# Patient Record
Sex: Male | Born: 1995 | Race: Black or African American | Hispanic: No | Marital: Single | Smoking: Never smoker
Health system: Southern US, Community
[De-identification: ages and names within clinical notes are randomized; demographics above are authoritative.]

## PROBLEM LIST (undated history)

## (undated) DIAGNOSIS — N200 Calculus of kidney: Secondary | ICD-10-CM

---
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
## 2017-06-04 ENCOUNTER — Emergency Department (HOSPITAL_COMMUNITY)
Admission: EM | Admit: 2017-06-04 | Discharge: 2017-06-05 | Disposition: A | Discharge status: Home / Self Care | Payer: 59 | Attending: Emergency Medicine | Admitting: Emergency Medicine

## 2017-06-04 ENCOUNTER — Encounter (HOSPITAL_COMMUNITY): Payer: Self-pay

## 2017-06-04 ENCOUNTER — Other Ambulatory Visit: Payer: Self-pay

## 2017-06-04 ENCOUNTER — Emergency Department (HOSPITAL_COMMUNITY): Payer: 59

## 2017-06-04 DIAGNOSIS — J988 Other specified respiratory disorders: Secondary | ICD-10-CM

## 2017-06-04 DIAGNOSIS — R0602 Shortness of breath: Secondary | ICD-10-CM | POA: Diagnosis present

## 2017-06-04 DIAGNOSIS — B9789 Other viral agents as the cause of diseases classified elsewhere: Secondary | ICD-10-CM

## 2017-06-04 DIAGNOSIS — B349 Viral infection, unspecified: Secondary | ICD-10-CM | POA: Insufficient documentation

## 2017-06-04 HISTORY — DX: Calculus of kidney: N20.0

## 2017-06-04 MED ORDER — BENZONATATE 100 MG PO CAPS: 100.0000 mg | ORAL_CAPSULE | Freq: Three times a day (TID) | ORAL | 0 refills | Status: AC | PRN

## 2017-06-04 MED ORDER — IBUPROFEN 600 MG PO TABS: 600.0000 mg | ORAL_TABLET | Freq: Four times a day (QID) | ORAL | 0 refills | Status: AC | PRN

## 2017-06-04 NOTE — ED Triage Notes (Signed)
States started 2 days ago with headache and now coughing up yellowish secretions and today was breathing fast and felt like he would pass out not eating much due to nausea and vomiting.

## 2017-06-04 NOTE — ED Provider Notes (Signed)
[facility]-EMERGENCY DEPT Provider Note   CSN: [id number] Arrival date & time: 06/04/17  2114     History   Chief Complaint Chief Complaint  Patient presents with  . Shortness of Breath    HPI Henry Pineda is a 22 y.o. male.  22 year old male presents to the emergency department for evaluation of upper respiratory symptoms.  He has been experiencing a cough productive of yellow sputum.  He denies hemoptysis.  He has also been experiencing nasal congestion with postnasal drip.  Mother reports fever of 101F prior to arrival for which patient received antipyretics.  He had a violent coughing episode prior to arrival which prompted posttussive emesis.  Patient then began to feel increasing palpitations.  He was hyperventilating at this time and developed paresthesias in his face, lips, extremities.  He felt lightheaded.  The symptoms have spontaneously resolved.  He denies a history of anxiety.  He has been medicating his cough/cold symptoms with DayQuil and NyQuil.  These have been providing little relief.     Past Medical History:  Diagnosis Date  . Kidney stones     Patient Active Problem List   Diagnosis Date Noted  . 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 No pertinent surgical history.      Home Medications    Prior to Admission medications   Medication Sig Start Date End Date Taking? Authorizing Provider  benzonatate (TESSALON) 100 MG capsule Take 1-2 capsules (100-200 mg total) by mouth 3 (three) times daily as needed for cough. 06/04/17   Antony Madura, PA-C  ibuprofen (ADVIL,MOTRIN) 600 MG tablet Take 1 tablet (600 mg total) by mouth every 6 (six) hours as needed. 06/04/17   Antony Madura, PA-C    Family History Family History  Problem Relation Age of Onset  . Hypertension Maternal Grandmother   . Diabetes Maternal Grandfather     Social History Social History   Tobacco Use  . Smoking status: Never Smoker  . Smokeless  tobacco: Never Used  Substance Use Topics  . Alcohol use: No  . Drug use: No     Allergies   Patient has no known allergies.   Review of Systems Review of Systems Ten systems reviewed and are negative for acute change, except as noted in the HPI.    Physical Exam Updated Vital Signs BP 114/64 (BP Location: Left Arm)   Pulse (!) 50   Temp (!) 97.4 F (36.3 C) (Oral)   Resp (!) 21   Ht 5\' 2"  (1.575 m)   Wt 54.4 kg (120 lb)   SpO2 96%   BMI 21.95 kg/m   Physical Exam  Constitutional: He is oriented to person, place, and time. He appears well-developed and well-nourished. No distress.  Nontoxic appearing, reserved. In NAD.  HENT:  Head: Normocephalic and atraumatic.  Eyes: Conjunctivae and EOM are normal. No scleral icterus.  Neck: Normal range of motion.  No meningismus  Cardiovascular: Normal rate, regular rhythm and intact distal pulses.  Pulmonary/Chest: Effort normal. No stridor. No respiratory distress. He has no wheezes. He has no rales.  Lungs CTAB  Musculoskeletal: Normal range of motion.  Neurological: He is alert and oriented to person, place, and time. He exhibits normal muscle tone. Coordination normal.  GCS 15. Moving all extremities spontaneously.  Skin: Skin is warm and dry. No rash noted. He is not diaphoretic. No erythema. No pallor.  Psychiatric: He has a normal mood and affect. His behavior is normal.  Nursing  note and vitals reviewed.    ED Treatments / Results  Labs (all labs ordered are listed, but only abnormal results are displayed) Labs Reviewed - No data to display  EKG EKG Interpretation  Date/Time:  Saturday June 04 2017 22:46:07 EDT Ventricular Rate:  50 PR Interval:    QRS Duration: 94 QT Interval:  436 QTC Calculation: 398 R Axis:   90 Text Interpretation:  Sinus bradycardia No previous tracing Confirmed by Cathren LaineSteinl, Kevin ([id number]) on 06/04/2017 11:45:31 PM   Radiology Dg Chest 2 View  Result Date: 06/04/2017 CLINICAL DATA:   Shortness of breath, cough, and fever starting 2 days ago. Nausea and vomiting. EXAM: CHEST - 2 VIEW COMPARISON:  None. FINDINGS: Thoracolumbar scoliosis convex to the right in the thoracic and to the left in the lumbar region. Normal heart size and pulmonary vascularity. No focal airspace disease or consolidation in the lungs. No blunting of costophrenic angles. No pneumothorax. Mediastinal contours appear intact. IMPRESSION: No active cardiopulmonary disease. Electronically Signed   By: Burman NievesWilliam  Stevens M.D.   On: 06/04/2017 23:35    Procedures Procedures (including critical care time)  Medications Ordered in ED Medications - No data to display   Initial Impression / Assessment and Plan / ED Course  I have reviewed the triage vital signs and the nursing notes.  Pertinent labs & imaging results that were available during my care of the patient were reviewed by me and considered in my medical decision making (see chart for details).     Patient's CXR negative for acute infiltrate. Patient's symptoms are consistent with URI, likely viral etiology. Sounds like he also experienced a panic attack PTA. EKG reassuring. Discussed that antibiotics are not indicated for viral infections. Patient will be discharged with symptomatic treatment. Patient verbalizes understanding and is agreeable with plan. Patient is hemodynamically stable and in NAD prior to discharge.   Final Clinical Impressions(s) / ED Diagnoses   Final diagnoses:  Viral respiratory illness    ED Discharge Orders        Ordered    benzonatate (TESSALON) 100 MG capsule  3 times daily PRN     06/04/17 2352    ibuprofen (ADVIL,MOTRIN) 600 MG tablet  Every 6 hours PRN     06/04/17 2352       Antony MaduraHumes, Analie Katzman, PA-C 06/05/17 2123    Cathren LaineSteinl, Kevin, MD 06/05/17 2234

## 2017-06-04 NOTE — Discharge Instructions (Signed)
Your workup in the emergency department was reassuring.  You may continue with the use of over-the-counter medications for symptom management.  We recommend ibuprofen for pain or body aches.  You have been prescribed Tessalon for cough management.  Continue to drink plenty of fluids to prevent dehydration.  Follow-up with your primary care doctor to ensure resolution of symptoms.

## 2017-06-06 ENCOUNTER — Emergency Department (HOSPITAL_BASED_OUTPATIENT_CLINIC_OR_DEPARTMENT_OTHER)
Admission: EM | Admit: 2017-06-06 | Discharge: 2017-06-06 | Disposition: A | Discharge status: Home / Self Care | Payer: 59 | Attending: Emergency Medicine | Admitting: Emergency Medicine

## 2017-06-06 ENCOUNTER — Encounter (HOSPITAL_BASED_OUTPATIENT_CLINIC_OR_DEPARTMENT_OTHER): Payer: Self-pay | Admitting: *Deleted

## 2017-06-06 ENCOUNTER — Other Ambulatory Visit: Payer: Self-pay

## 2017-06-06 DIAGNOSIS — Z87442 Personal history of urinary calculi: Secondary | ICD-10-CM | POA: Insufficient documentation

## 2017-06-06 DIAGNOSIS — R0602 Shortness of breath: Secondary | ICD-10-CM | POA: Insufficient documentation

## 2017-06-06 DIAGNOSIS — R110 Nausea: Secondary | ICD-10-CM | POA: Diagnosis not present

## 2017-06-06 DIAGNOSIS — R197 Diarrhea, unspecified: Secondary | ICD-10-CM | POA: Diagnosis not present

## 2017-06-06 DIAGNOSIS — R059 Cough, unspecified: Secondary | ICD-10-CM

## 2017-06-06 DIAGNOSIS — R05 Cough: Secondary | ICD-10-CM | POA: Insufficient documentation

## 2017-06-06 DIAGNOSIS — R51 Headache: Secondary | ICD-10-CM | POA: Diagnosis not present

## 2017-06-06 DIAGNOSIS — R509 Fever, unspecified: Secondary | ICD-10-CM | POA: Insufficient documentation

## 2017-06-06 DIAGNOSIS — R0789 Other chest pain: Secondary | ICD-10-CM | POA: Insufficient documentation

## 2017-06-06 MED ORDER — ACETAMINOPHEN 325 MG PO TABS
650.0000 mg | ORAL_TABLET | Freq: Once | ORAL | Status: AC | PRN
  Administered 2017-06-06: 650 mg via ORAL
  Filled 2017-06-06: qty 2

## 2017-06-06 MED ORDER — DOXYCYCLINE HYCLATE 100 MG PO CAPS: 100.0000 mg | ORAL_CAPSULE | Freq: Two times a day (BID) | ORAL | 0 refills | Status: AC

## 2017-06-06 MED ORDER — DOXYCYCLINE HYCLATE 100 MG PO TABS
100.0000 mg | ORAL_TABLET | Freq: Once | ORAL | Status: AC
  Administered 2017-06-06: 100 mg via ORAL
  Filled 2017-06-06: qty 1

## 2017-06-06 NOTE — ED Notes (Signed)
ED Provider at bedside. 

## 2017-06-06 NOTE — Discharge Instructions (Signed)
Take Doxycycline twice daily for one week Follow up with your doctor if symptoms aren't improving Continue over the counter medicines for cough/cold

## 2017-06-06 NOTE — ED Provider Notes (Signed)
[facility] EMERGENCY DEPARTMENT Provider Note   CSN: [id number] Arrival date & time: 06/06/17  2029     History   Chief Complaint Chief Complaint  Patient presents with  . Fever  . Cough    HPI Henry Pineda is a 22 y.o. male who presents with a fever and a cough.  No significant past medical history.  The patient states that his symptoms have been ongoing for about a week now.  He was evaluated in the emergency department 3 days ago.  At that time he was having chest tightness and shortness of breath.  His EKG was normal and his chest x-ray was negative.  He was advised that his symptoms are likely viral and to continue supportive care.  Since he was seen he has had ongoing symptoms.  He reports a fever, chills, headache, shortness of breath, cough, decreased appetite, nausea, diarrhea.  He does have chest pain with coughing but this is not constant.  He denies congestion, runny nose, ear pain, sore throat, chest pain, abdominal pain.  He has had a sick contacts.  He has not had his flu shot this year.  HPI  Past Medical History:  Diagnosis Date  . Kidney stones     Patient Active Problem List   Diagnosis Date Noted  . 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 No pertinent surgical history.      Home Medications    Prior to Admission medications   Medication Sig Start Date End Date Taking? Authorizing Provider  benzonatate (TESSALON) 100 MG capsule Take 1-2 capsules (100-200 mg total) by mouth 3 (three) times daily as needed for cough. 06/04/17   Antony Madura, PA-C  doxycycline (VIBRAMYCIN) 100 MG capsule Take 1 capsule (100 mg total) by mouth 2 (two) times daily. 06/06/17   Bethel Born, PA-C  ibuprofen (ADVIL,MOTRIN) 600 MG tablet Take 1 tablet (600 mg total) by mouth every 6 (six) hours as needed. 06/04/17   Antony Madura, PA-C    Family History Family History  Problem Relation Age of Onset  . Hypertension Maternal Grandmother   . Diabetes  Maternal Grandfather     Social History Social History   Tobacco Use  . Smoking status: Never Smoker  . Smokeless tobacco: Never Used  Substance Use Topics  . Alcohol use: No  . Drug use: No     Allergies   Patient has no known allergies.   Review of Systems Review of Systems  Constitutional: Positive for activity change, appetite change, chills and fever.  HENT: Negative for congestion, ear pain, rhinorrhea and sore throat.   Respiratory: Positive for cough and shortness of breath. Negative for wheezing.   Cardiovascular: Positive for chest pain (With coughing).  Gastrointestinal: Positive for diarrhea and nausea. Negative for abdominal pain and vomiting.  Genitourinary: Negative for decreased urine volume and dysuria.  All other systems reviewed and are negative.    Physical Exam Updated Vital Signs BP 130/68 (BP Location: Right Arm)   Pulse 63   Temp 100.2 F (37.9 C) (Oral)   Resp 18   Ht 5\' 2"  (1.575 m)   Wt 54.4 kg (120 lb)   SpO2 99%   BMI 21.95 kg/m   Physical Exam  Constitutional: He is oriented to person, place, and time. He appears well-developed and well-nourished. No distress.  Thin, calm, cooperative  HENT:  Head: Normocephalic and atraumatic.  Right Ear: Hearing, tympanic membrane, external ear and ear canal normal.  Left  Ear: Hearing, tympanic membrane, external ear and ear canal normal.  Nose: Nose normal.  Mouth/Throat: Uvula is midline, oropharynx is clear and moist and mucous membranes are normal.  Eyes: Pupils are equal, round, and reactive to light. Conjunctivae are normal. Right eye exhibits no discharge. Left eye exhibits no discharge. No scleral icterus.  Neck: Normal range of motion.  Cardiovascular: Normal rate and regular rhythm.  Pulmonary/Chest: Effort normal and breath sounds normal. No respiratory distress.  Abdominal: Soft. Bowel sounds are normal. He exhibits no distension. There is no tenderness.  Neurological: He is alert  and oriented to person, place, and time.  Skin: Skin is warm and dry.  Psychiatric: He has a normal mood and affect. His behavior is normal.  Nursing note and vitals reviewed.    ED Treatments / Results  Labs (all labs ordered are listed, but only abnormal results are displayed) Labs Reviewed - No data to display  EKG None  Radiology No results found.  Procedures Procedures (including critical care time)  Medications Ordered in ED Medications  acetaminophen (TYLENOL) tablet 650 mg (650 mg Oral Given 06/06/17 2109)  doxycycline (VIBRA-TABS) tablet 100 mg (100 mg Oral Given 06/06/17 2335)     Initial Impression / Assessment and Plan / ED Course  I have reviewed the triage vital signs and the nursing notes.  Pertinent labs & imaging results that were available during my care of the patient were reviewed by me and considered in my medical decision making (see chart for details).  22 year old male presents with fever and multiple symptoms. His main concern is his cough. He has an elevated temperature but otherwise vitals are normal. He appears fatigued on exam and is withdrawn. No obvious signs of infection on exam although he does have a coarse cough. Since his symptoms have been persistent, will rx Doxycycline for one week. Advised continue OTC meds and follow up with primary care in 2-3 days if symptoms are not improving.   Final Clinical Impressions(s) / ED Diagnoses   Final diagnoses:  Cough    ED Discharge Orders        Ordered    doxycycline (VIBRAMYCIN) 100 MG capsule  2 times daily     06/06/17 2330       Bethel BornGekas, Kelly Marie, PA-C 06/07/17 0008    Doug SouJacubowitz, Sam, MD 06/07/17 [id number]

## 2017-06-06 NOTE — ED Triage Notes (Signed)
Fever, vomiting, cough x 4 days. [facility] but is no better.

## 2019-02-22 IMAGING — CR DG CHEST 2V
2 series · 2 of 2 positions shown · non-contrast
Comparison: None.

CLINICAL DATA: Shortness of breath, cough, and fever starting 2
days ago. Nausea and vomiting.

EXAM:
CHEST - 2 VIEW

[w chest pa]
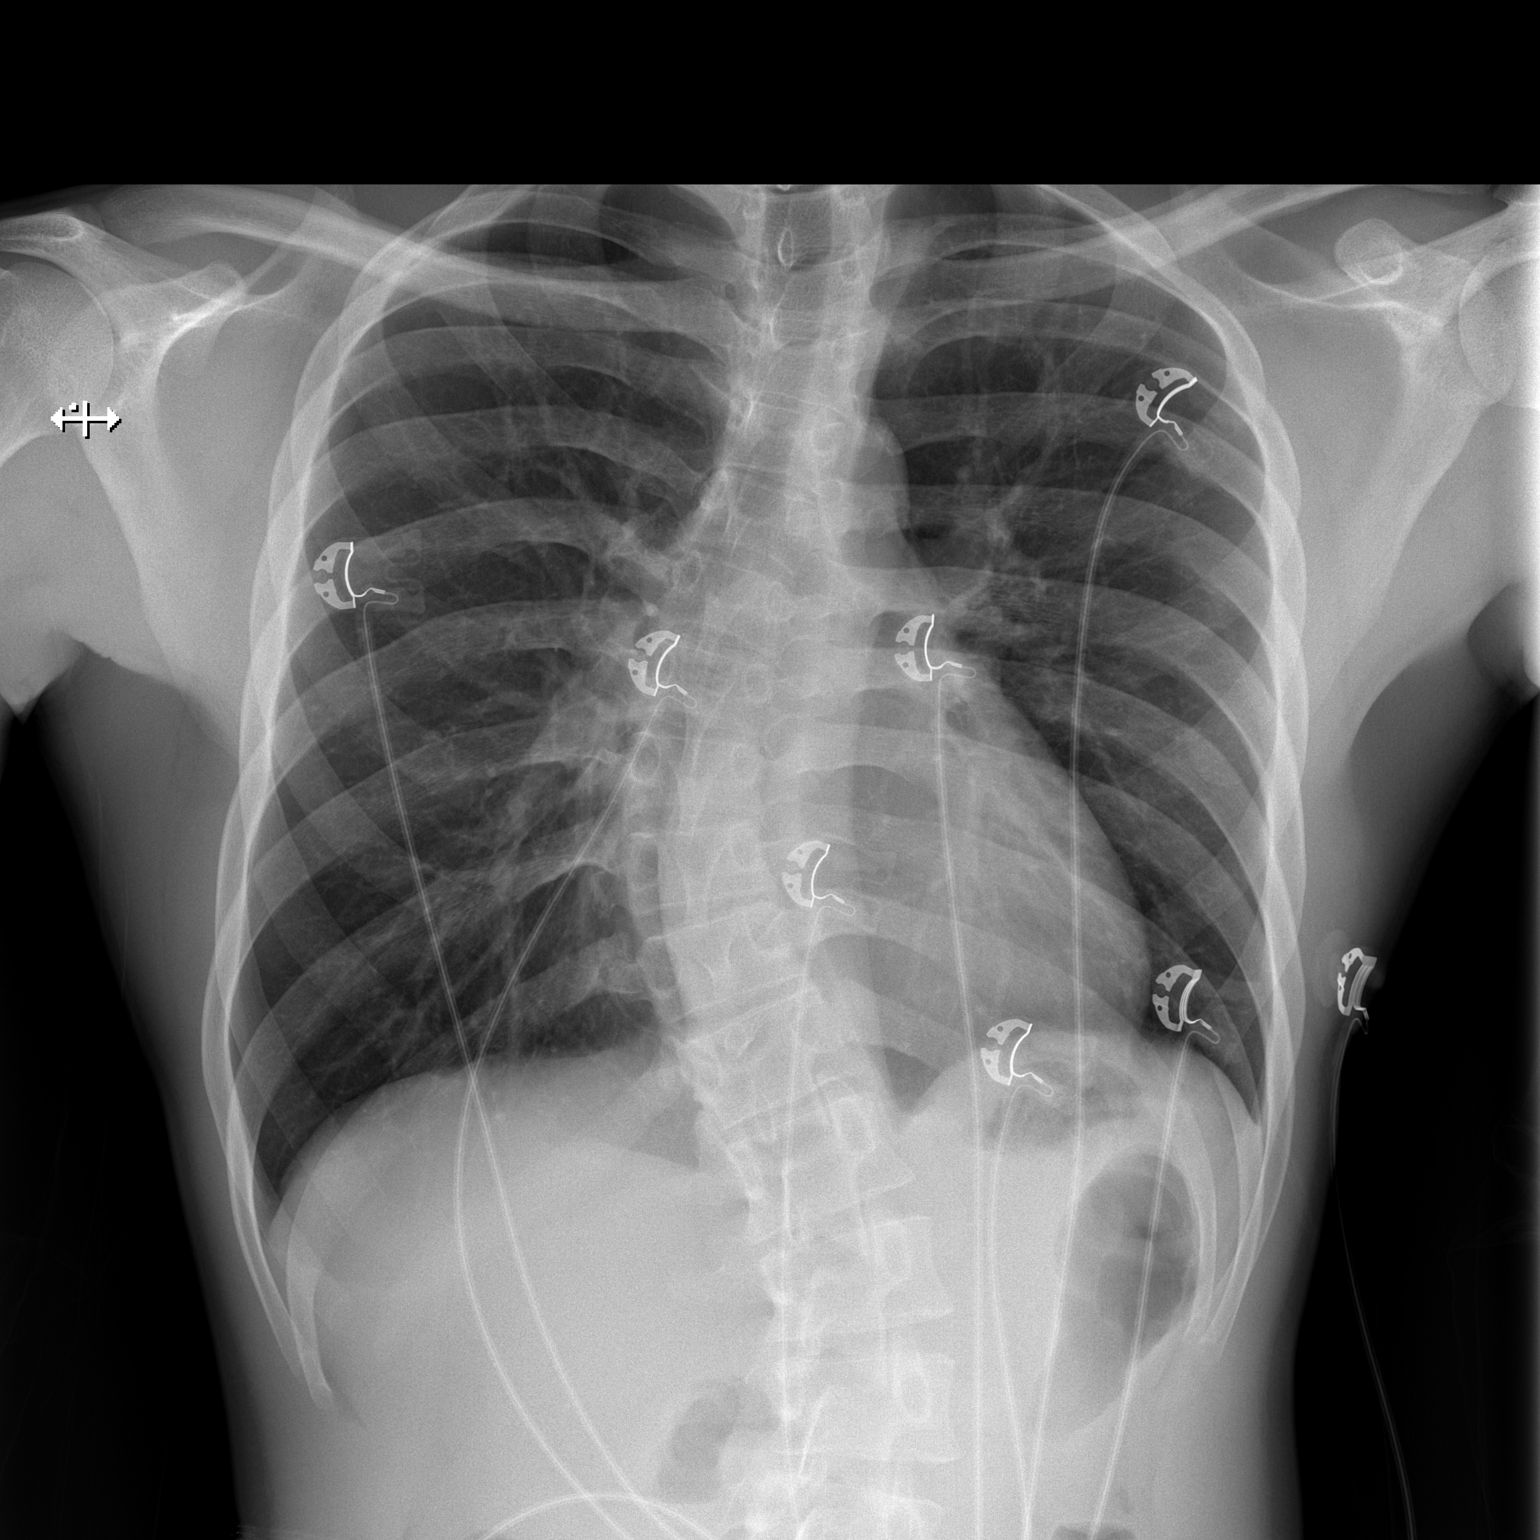

[w chest lat]
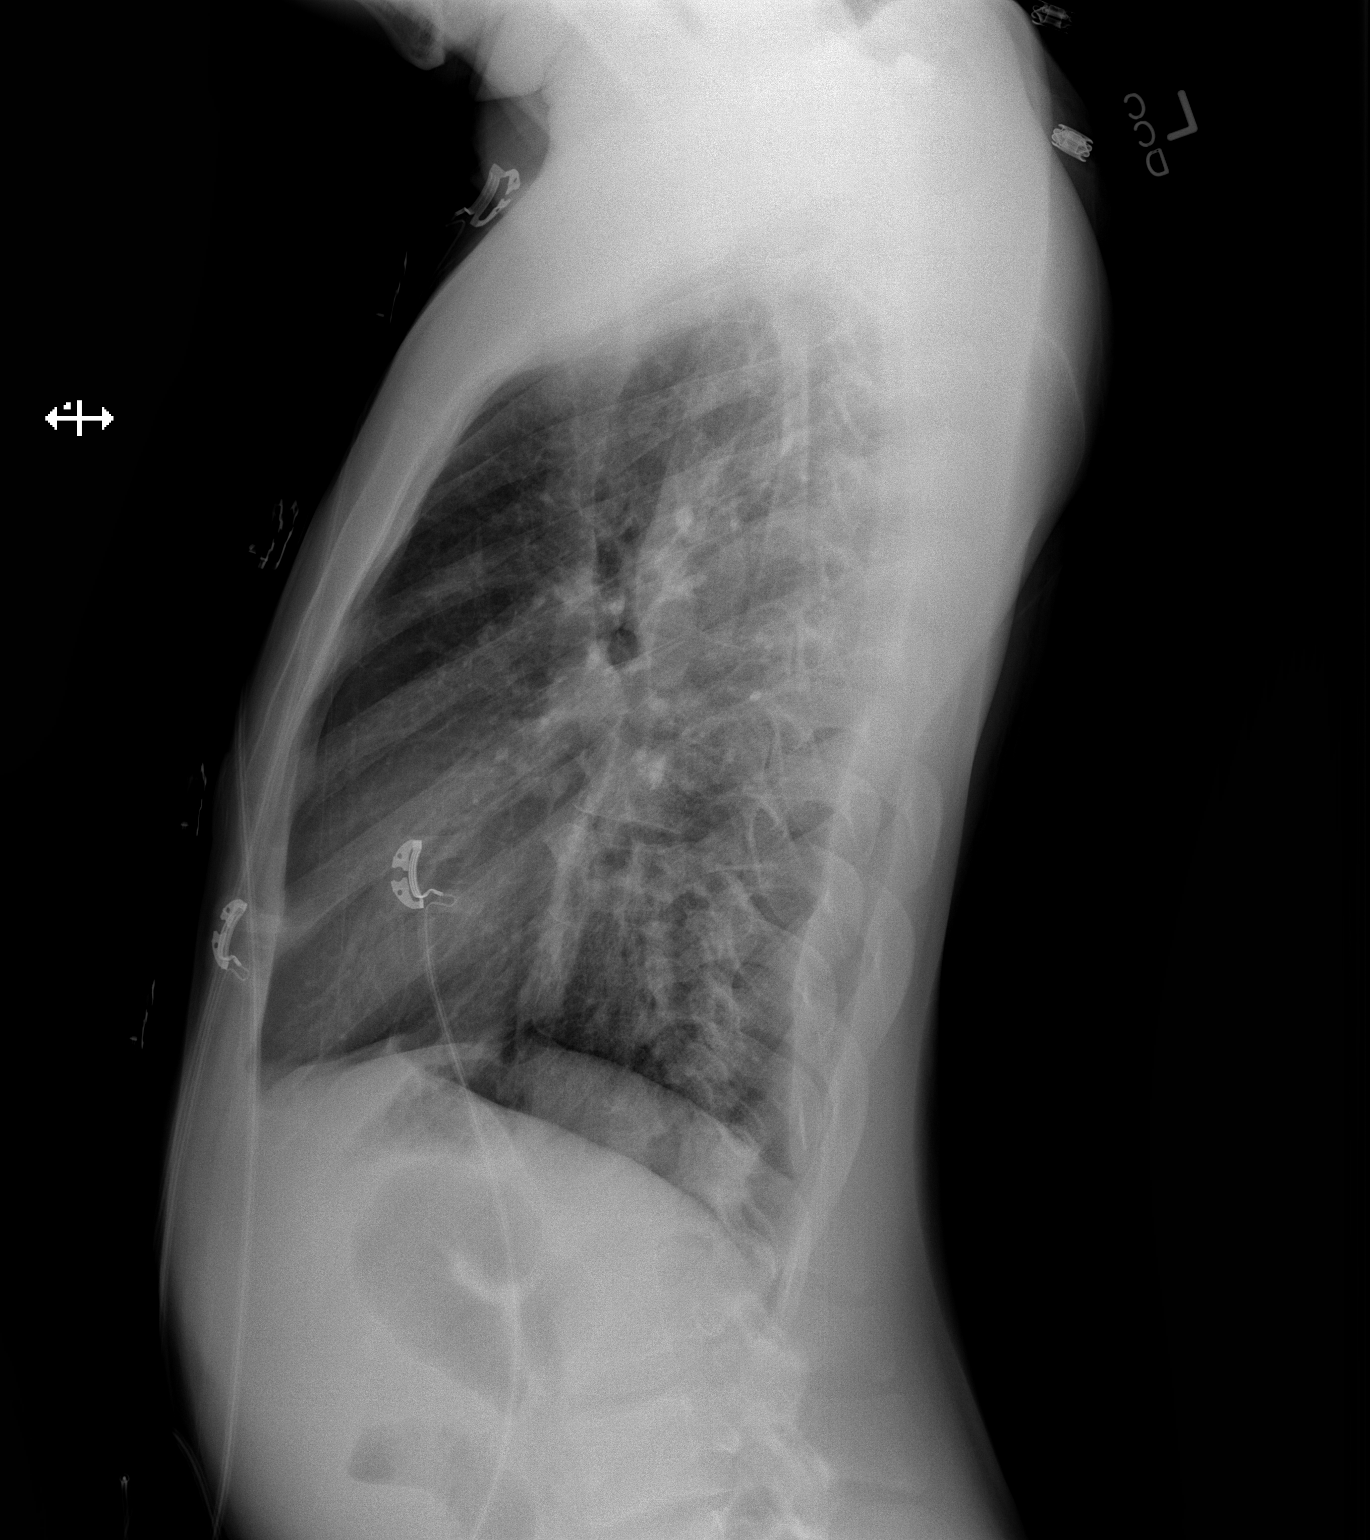

[2 of 2 positions shown; findings below may reference images not displayed]

FINDINGS: Thoracolumbar scoliosis convex to the right in the thoracic and to
the left in the lumbar region. Normal heart size and pulmonary
vascularity. No focal airspace disease or consolidation in the
lungs. No blunting of costophrenic angles. No pneumothorax.
Mediastinal contours appear intact.
IMPRESSION: No active cardiopulmonary disease.

## 2021-11-19 DIAGNOSIS — U071 COVID-19: Secondary | ICD-10-CM | POA: Insufficient documentation

## 2021-11-20 ENCOUNTER — Emergency Department (HOSPITAL_BASED_OUTPATIENT_CLINIC_OR_DEPARTMENT_OTHER)
Admission: EM | Admit: 2021-11-20 | Discharge: 2021-11-20 | Disposition: A | Discharge status: Home / Self Care | Payer: 59 | Attending: Emergency Medicine | Admitting: Emergency Medicine

## 2021-11-20 ENCOUNTER — Encounter (HOSPITAL_BASED_OUTPATIENT_CLINIC_OR_DEPARTMENT_OTHER): Payer: Self-pay

## 2021-11-20 DIAGNOSIS — U071 COVID-19: Secondary | ICD-10-CM

## 2021-11-20 LAB — COMPREHENSIVE METABOLIC PANEL
ALT: 9 U/L (ref 0–44)
AST: 16 U/L (ref 15–41)
Albumin: 4.5 g/dL (ref 3.5–5.0)
Alkaline Phosphatase: 47 U/L (ref 38–126)
Anion gap: 11 (ref 5–15)
BUN: 13 mg/dL (ref 6–20)
CO2: 23 mmol/L (ref 22–32)
Calcium: 9.3 mg/dL (ref 8.9–10.3)
Chloride: 101 mmol/L (ref 98–111)
Creatinine, Ser: 0.89 mg/dL (ref 0.61–1.24)
GFR, Estimated: >60 mL/min (ref >60)
Glucose, Bld: 88 mg/dL (ref 70–99)
Potassium: 3.5 mmol/L (ref 3.5–5.1)
Sodium: 135 mmol/L (ref 135–145)
Total Bilirubin: 0.3 mg/dL (ref 0.3–1.2)
Total Protein: 7.3 g/dL (ref 6.5–8.1)

## 2021-11-20 LAB — RESP PANEL BY RT-PCR (FLU A&B, COVID) ARPGX2
Influenza A by PCR: NEGATIVE
Influenza B by PCR: NEGATIVE
SARS Coronavirus 2 by RT PCR: POSITIVE — AB

## 2021-11-20 LAB — CBC
HCT: 46.4 % (ref 39.0–52.0)
Hemoglobin: 15.7 g/dL (ref 13.0–17.0)
MCH: 29.2 pg (ref 26.0–34.0)
MCHC: 33.8 g/dL (ref 30.0–36.0)
MCV: 86.2 fL (ref 80.0–100.0)
Platelets: 273 10*3/uL (ref 150–400)
RBC: 5.38 MIL/uL (ref 4.22–5.81)
RDW: 12.4 % (ref 11.5–15.5)
WBC: 3.3 10*3/uL — ABNORMAL LOW (ref 4.0–10.5)
nRBC: 0 % (ref 0.0–0.2)

## 2021-11-20 LAB — LIPASE, BLOOD: Lipase: 23 U/L (ref 11–51)

## 2021-11-20 NOTE — ED Provider Notes (Signed)
[facility] EMERGENCY DEPT Provider Note   CSN: [id number] Arrival date & time: 11/19/21  2357     History  Chief Complaint  Patient presents with   Fever   Headache    Henry Pineda is a 26 y.o. male.  Patient is a 26 year old male presenting with complaints of diarrhea, cough, body aches, headache and feeling generally unwell.  This has been ongoing for the past 3 days.  He has felt fevered and chilled.  He denies ill contacts.  There are no aggravating or alleviating factors.  He also describes decreased urine output for the past 2 days, but was able to urinate in the ER.  He denies dysuria or hematuria.  The history is provided by the patient.       Home Medications Prior to Admission medications   Medication Sig Start Date End Date Taking? Authorizing Provider  benzonatate (TESSALON) 100 MG capsule Take 1-2 capsules (100-200 mg total) by mouth 3 (three) times daily as needed for cough. 06/04/17   Antony Madura, PA-C  doxycycline (VIBRAMYCIN) 100 MG capsule Take 1 capsule (100 mg total) by mouth 2 (two) times daily. 06/06/17   Bethel Born, PA-C  ibuprofen (ADVIL,MOTRIN) 600 MG tablet Take 1 tablet (600 mg total) by mouth every 6 (six) hours as needed. 06/04/17   Antony Madura, PA-C      Allergies    Patient has no known allergies.    Review of Systems   Review of Systems  All other systems reviewed and are negative.   Physical Exam Updated Vital Signs BP 130/78 (BP Location: Right Arm)   Pulse 60   Temp 98.6 F (37 C) (Oral)   Resp 18   Ht 5\' 5"  (1.651 m)   Wt 59 kg   SpO2 100%   BMI 21.63 kg/m  Physical Exam Vitals and nursing note reviewed.  Constitutional:      General: He is not in acute distress.    Appearance: He is well-developed. He is not diaphoretic.  HENT:     Head: Normocephalic and atraumatic.  Cardiovascular:     Rate and Rhythm: Normal rate and regular rhythm.     Heart sounds: No murmur heard.    No friction rub.   Pulmonary:     Effort: Pulmonary effort is normal. No respiratory distress.     Breath sounds: Normal breath sounds. No wheezing or rales.  Abdominal:     General: Bowel sounds are normal. There is no distension.     Palpations: Abdomen is soft.     Tenderness: There is no abdominal tenderness.  Musculoskeletal:        General: Normal range of motion.     Cervical back: Normal range of motion and neck supple.  Skin:    General: Skin is warm and dry.  Neurological:     Mental Status: He is alert and oriented to person, place, and time.     Coordination: Coordination normal.     ED Results / Procedures / Treatments   Labs (all labs ordered are listed, but only abnormal results are displayed) Labs Reviewed  RESP PANEL BY RT-PCR (FLU A&B, COVID) ARPGX2 - Abnormal; Notable for the following components:      Result Value   SARS Coronavirus 2 by RT PCR POSITIVE (*)    All other components within normal limits  CBC - Abnormal; Notable for the following components:   WBC 3.3 (*)    All other components within normal  limits  LIPASE, BLOOD  COMPREHENSIVE METABOLIC PANEL  URINALYSIS, ROUTINE W REFLEX MICROSCOPIC    EKG None  Radiology No results found.  Procedures Procedures    Medications Ordered in ED Medications - No data to display  ED Course/ Medical Decision Making/ A&P  Patient presenting with complaints as described in the HPI.  Symptoms seem viral in nature and COVID test has resulted positive.  His laboratory studies are unremarkable including BUN and creatinine, so I am uncertain as to the decreased urine output.  He was able to urinate here without difficulty.  There is no hypoxia and vital signs are stable.  I feel as though patient can safely be discharged with over-the-counter medications, rest, and follow-up as needed.  Final Clinical Impression(s) / ED Diagnoses Final diagnoses:  None    Rx / DC Orders ED Discharge Orders     None          Veryl Speak, MD 11/20/21 0202

## 2021-11-20 NOTE — Discharge Instructions (Signed)
Take Tylenol 1000 mg rotated with ibuprofen 600 mg every 4 hours as needed for pain or fever.  Take over-the-counter medications as needed for relief of symptoms.  Drink plenty of fluids and get plenty of rest.  Isolate for a total of 5 days or until you are no longer symptomatic.

## 2021-11-20 NOTE — ED Triage Notes (Signed)
Pt presents to the ED with fevers, headache, chills, nausea, and diarrhea since Wednesday. States that he has not urinated in two days.

## 2022-01-08 ENCOUNTER — Emergency Department (HOSPITAL_BASED_OUTPATIENT_CLINIC_OR_DEPARTMENT_OTHER)
Admission: EM | Admit: 2022-01-08 | Discharge: 2022-01-08 | Disposition: A | Discharge status: Home / Self Care | Payer: Self-pay | Attending: Emergency Medicine | Admitting: Emergency Medicine

## 2022-01-08 ENCOUNTER — Encounter (HOSPITAL_BASED_OUTPATIENT_CLINIC_OR_DEPARTMENT_OTHER): Payer: Self-pay | Admitting: Emergency Medicine

## 2022-01-08 DIAGNOSIS — Z23 Encounter for immunization: Secondary | ICD-10-CM | POA: Insufficient documentation

## 2022-01-08 DIAGNOSIS — Y92003 Bedroom of unspecified non-institutional (private) residence as the place of occurrence of the external cause: Secondary | ICD-10-CM | POA: Insufficient documentation

## 2022-01-08 DIAGNOSIS — S0181XA Laceration without foreign body of other part of head, initial encounter: Secondary | ICD-10-CM | POA: Insufficient documentation

## 2022-01-08 DIAGNOSIS — W228XXA Striking against or struck by other objects, initial encounter: Secondary | ICD-10-CM | POA: Insufficient documentation

## 2022-01-08 MED ORDER — TETANUS-DIPHTH-ACELL PERTUSSIS 5-2.5-18.5 LF-MCG/0.5 IM SUSY
0.5000 mL | PREFILLED_SYRINGE | Freq: Once | INTRAMUSCULAR | Status: AC
  Administered 2022-01-08: 0.5 mL via INTRAMUSCULAR
  Filled 2022-01-08: qty 0.5

## 2022-01-08 MED ORDER — LIDOCAINE-EPINEPHRINE (PF) 2 %-1:200000 IJ SOLN
10.0000 mL | Freq: Once | INTRAMUSCULAR | Status: AC
  Administered 2022-01-08: 10 mL
  Filled 2022-01-08: qty 20

## 2022-01-08 NOTE — ED Provider Notes (Signed)
[facility] EMERGENCY DEPT Provider Note   CSN: [id number] Arrival date & time: 01/08/22  1348     History  Chief Complaint  Patient presents with   Head Injury    Henry Pineda is a 26 y.o. male.  26 year old male present today for evaluation of head laceration.  This occurred this morning as he was trying to get away from a bug that was buzzing in his right ear.  As he pulled his head away he hit his forehead on the edge of his bed.  Denies other injury.  Denies loss of consciousness.  Not on anticoagulation.  The history is provided by the patient. No language interpreter was used.       Home Medications Prior to Admission medications   Medication Sig Start Date End Date Taking? Authorizing Provider  benzonatate (TESSALON) 100 MG capsule Take 1-2 capsules (100-200 mg total) by mouth 3 (three) times daily as needed for cough. 06/04/17   Antonietta Breach, PA-C  doxycycline (VIBRAMYCIN) 100 MG capsule Take 1 capsule (100 mg total) by mouth 2 (two) times daily. 06/06/17   Recardo Evangelist, PA-C  ibuprofen (ADVIL,MOTRIN) 600 MG tablet Take 1 tablet (600 mg total) by mouth every 6 (six) hours as needed. 06/04/17   Antonietta Breach, PA-C      Allergies    Patient has no known allergies.    Review of Systems   Review of Systems  Skin:  Positive for wound.  Neurological:  Negative for syncope.  All other systems reviewed and are negative.   Physical Exam Updated Vital Signs BP 139/75 (BP Location: Right Arm)   Pulse 64   Temp 98 F (36.7 C)   Resp 16   SpO2 100%  Physical Exam Vitals and nursing note reviewed.  Constitutional:      General: He is not in acute distress.    Appearance: Normal appearance. He is not ill-appearing.  HENT:     Head: Normocephalic and atraumatic.     Comments: 2.5 cm laceration noted to forehead.  No active bleeding.    Nose: Nose normal.  Eyes:     Conjunctiva/sclera: Conjunctivae normal.  Pulmonary:     Effort: Pulmonary  effort is normal. No respiratory distress.  Musculoskeletal:        General: No deformity.  Skin:    Findings: No rash.  Neurological:     Mental Status: He is alert.     ED Results / Procedures / Treatments   Labs (all labs ordered are listed, but only abnormal results are displayed) Labs Reviewed - No data to display  EKG None  Radiology No results found.  Procedures .Marland KitchenLaceration Repair  Date/Time: 01/08/2022 2:54 PM  Performed by: Evlyn Courier, PA-C Authorized by: Evlyn Courier, PA-C   Consent:    Consent obtained:  Verbal   Risks discussed:  Poor cosmetic result, poor wound healing and infection Universal protocol:    Procedure explained and questions answered to patient or proxy's satisfaction: yes     Relevant documents present and verified: yes   Laceration details:    Location:  Face   Face location:  Forehead   Length (cm):  2 Pre-procedure details:    Preparation:  Patient was prepped and draped in usual sterile fashion Treatment:    Area cleansed with:  Povidone-iodine and saline   Amount of cleaning:  Standard   Irrigation volume:  500   Debridement:  None   Undermining:  None Skin repair:  Repair method:  Sutures   Suture size:  5-0   Suture material:  Prolene   Suture technique:  Simple interrupted   Number of sutures:  3 Approximation:    Approximation:  Close Repair type:    Repair type:  Simple Post-procedure details:    Dressing:  Open (no dressing)   Procedure completion:  Tolerated well, no immediate complications     Medications Ordered in ED Medications  Tdap (BOOSTRIX) injection 0.5 mL (has no administration in time range)  lidocaine-EPINEPHrine (XYLOCAINE W/EPI) 2 %-1:200000 (PF) injection 10 mL (has no administration in time range)    ED Course/ Medical Decision Making/ A&P                           Medical Decision Making Risk Prescription drug management.   26 year old male presents today for evaluation of forehead  laceration that occurred just prior to arrival.  Denies other injuries.  Denies loss of consciousness.  Not on anticoagulation.  Laceration repaired with 3 stitches.  Wound care discussed.  Return precautions provided.  Patient voices understanding and is in agreement with plan.   Final Clinical Impression(s) / ED Diagnoses Final diagnoses:  Forehead laceration, initial encounter    Rx / DC Orders ED Discharge Orders     None         Marita Kansas, PA-C 01/08/22 1459    Derwood Kaplan, MD 01/09/22 [phone]

## 2022-01-08 NOTE — ED Provider Triage Note (Signed)
Emergency Medicine Provider Triage Evaluation Note  Henry Pineda , a 26 y.o. male  was evaluated in triage.  Pt complains of laceration to forehead.  Happened as he was trying to avoid a bug.  Hit his head on the corner of his bed.  Unknown last tetanus shot.  Review of Systems  Positive: As above Negative: As above  Physical Exam  BP 139/75 (BP Location: Right Arm)   Pulse 64   Temp 98 F (36.7 C)   Resp 16   SpO2 100%  Gen:   Awake, no distress   Resp:  Normal effort  MSK:   Moves extremities without difficulty  Other:    Medical Decision Making  Medically screening exam initiated at 2:04 PM.  Appropriate orders placed.  Henry Pineda was informed that the remainder of the evaluation will be completed by another provider, this initial triage assessment does not replace that evaluation, and the importance of remaining in the ED until their evaluation is complete.     Marita Kansas, PA-C 01/08/22 1405

## 2022-01-08 NOTE — ED Triage Notes (Signed)
Laceration to forehead. He bent down and hit his head on his bed.

## 2022-01-08 NOTE — Discharge Instructions (Addendum)
Your laceration was repaired with 3 stitches.  The stitches will need to be removed in about 7 days.  You can go to an urgent care, or follow-up in the emergency room to have these taken out.  Since you do not have a primary care provider I have given you information for [facility] to establish a primary care provider.  For any signs or symptoms concerning for infection such as fever, increased swelling, drainage, or significant redness surrounding the wound please return for evaluation.  You can apply Neosporin over the wound.  Your tetanus shot was updated today as well.

## 2022-01-15 ENCOUNTER — Encounter (HOSPITAL_BASED_OUTPATIENT_CLINIC_OR_DEPARTMENT_OTHER): Payer: Self-pay

## 2022-01-15 ENCOUNTER — Emergency Department (HOSPITAL_BASED_OUTPATIENT_CLINIC_OR_DEPARTMENT_OTHER)
Admission: EM | Admit: 2022-01-15 | Discharge: 2022-01-15 | Disposition: A | Discharge status: Home / Self Care | Payer: Self-pay | Attending: Emergency Medicine | Admitting: Emergency Medicine

## 2022-01-15 DIAGNOSIS — Z4802 Encounter for removal of sutures: Secondary | ICD-10-CM | POA: Insufficient documentation

## 2022-01-15 DIAGNOSIS — S0181XD Laceration without foreign body of other part of head, subsequent encounter: Secondary | ICD-10-CM | POA: Insufficient documentation

## 2022-01-15 DIAGNOSIS — X58XXXD Exposure to other specified factors, subsequent encounter: Secondary | ICD-10-CM | POA: Insufficient documentation

## 2022-01-15 NOTE — Discharge Instructions (Addendum)
Please take tylenol/ibuprofen for pain. Please do not hesitate to return to emergency department if worrisome signs symptoms we discussed become apparent.

## 2022-01-15 NOTE — ED Triage Notes (Signed)
Stiches left forehead for about one week   States need to have removed.  Wound appears to be healing well

## 2022-01-15 NOTE — ED Provider Notes (Signed)
[facility] EMERGENCY DEPT Provider Note   CSN: [id number] Arrival date & time: 01/15/22  1823     History {Add pertinent medical, surgical, social history, OB history to HPI:1} Chief Complaint  Patient presents with   Suture / Staple Removal    Henry Pineda is a 26 y.o. male presenting to the emergency room for suture removal.  Patient has stitches on the left forehead placed a week ago.  Reports no blood or fluid drainage.  Denies fever.    Suture / Staple Removal Pertinent negatives include no chest pain, no abdominal pain and no shortness of breath.       Home Medications Prior to Admission medications   Medication Sig Start Date End Date Taking? Authorizing Provider  benzonatate (TESSALON) 100 MG capsule Take 1-2 capsules (100-200 mg total) by mouth 3 (three) times daily as needed for cough. 06/04/17   Antony Madura, PA-C  doxycycline (VIBRAMYCIN) 100 MG capsule Take 1 capsule (100 mg total) by mouth 2 (two) times daily. 06/06/17   Bethel Born, PA-C  ibuprofen (ADVIL,MOTRIN) 600 MG tablet Take 1 tablet (600 mg total) by mouth every 6 (six) hours as needed. 06/04/17   Antony Madura, PA-C      Allergies    Patient has no known allergies.    Review of Systems   Review of Systems  Constitutional:  Negative for chills and fever.  HENT:  Negative for ear pain and sore throat.   Eyes:  Negative for pain and visual disturbance.  Respiratory:  Negative for cough and shortness of breath.   Cardiovascular:  Negative for chest pain and palpitations.  Gastrointestinal:  Negative for abdominal pain and vomiting.  Genitourinary:  Negative for dysuria and hematuria.  Musculoskeletal:  Negative for arthralgias and back pain.  Skin:  Negative for color change and rash.  Neurological:  Negative for seizures and syncope.  All other systems reviewed and are negative.   Physical Exam Updated Vital Signs BP 139/78 (BP Location: Right Arm)   Pulse 70   Temp 98.6  F (37 C) (Oral)   Resp 16   Ht 5\' 5"  (1.651 m)   Wt 59 kg   SpO2 100%   BMI 21.64 kg/m  Physical Exam Vitals and nursing note reviewed.  Constitutional:      Appearance: Normal appearance.  HENT:     Head: Normocephalic and atraumatic.     Mouth/Throat:     Mouth: Mucous membranes are moist.  Eyes:     General: No scleral icterus. Cardiovascular:     Rate and Rhythm: Normal rate and regular rhythm.     Pulses: Normal pulses.     Heart sounds: Normal heart sounds.  Pulmonary:     Effort: Pulmonary effort is normal.     Breath sounds: Normal breath sounds.  Abdominal:     General: Abdomen is flat.     Palpations: Abdomen is soft.     Tenderness: There is no abdominal tenderness.  Musculoskeletal:        General: No deformity.  Skin:    General: Skin is warm.     Findings: No rash.     Comments: 3 stitches on the left forehead.  No blood or fluid drainage.  No swelling or erythema on surrounding skin.  Neurological:     General: No focal deficit present.     Mental Status: He is alert.  Psychiatric:        Mood and Affect: Mood normal.  ED Results / Procedures / Treatments   Labs (all labs ordered are listed, but only abnormal results are displayed) Labs Reviewed - No data to display  EKG None  Radiology No results found.  Procedures Procedures  {Document cardiac monitor, telemetry assessment procedure when appropriate:1}  Medications Ordered in ED Medications - No data to display  ED Course/ Medical Decision Making/ A&P                           Medical Decision Making  This patient presents to the ED for suture removal, this involves an extensive number of treatment options, and is a complaint that carries with a high risk of complications and morbidity.  The differential diagnosis includes cellulitis, infectious etiology.  This is not an exhaustive list.  Comorbidities that complicate the patient evaluation See HPI  Social determinants of  health NA  Additional history obtained: Additional history obtained from EMR. External records from outside source obtained and review including prior procedure.  Cardiac monitoring/EKG:  Lab tests:   Imaging studies:  Problem list/ ED course/ Critical interventions/ Medical management: HPI: See above Vital signs within normal range and stable throughout visit. Laboratory/imaging studies significant for: See above. On physical examination, patient is afebrile and appears in no acute distress. There are 3 ethilon sutures on L forehead.  No blood or fluid drainage.  No swelling or erythema on the surrounding skin.  Sutures removed by me followed by bandage.  I have reviewed the patient home medicines and have made adjustments as needed.  Consultations obtained:  Disposition Continued outpatient therapy. Follow-up with PCP recommended for reevaluation of symptoms. Treatment plan discussed with patient.  Pt acknowledged understanding was agreeable to the plan. Worrisome signs and symptoms were discussed with patient, and patient acknowledged understanding to return to the ED if they noticed these signs and symptoms. Patient was stable upon discharge.   This chart was dictated using voice recognition software.  Despite best efforts to proofread,  errors can occur which can change the documentation meaning.    {Document critical care time when appropriate:1} {Document review of labs and clinical decision tools ie heart score, Chads2Vasc2 etc:1}  {Document your independent review of radiology images, and any outside records:1} {Document your discussion with family members, caretakers, and with consultants:1} {Document social determinants of health affecting pt's care:1} {Document your decision making why or why not admission, treatments were needed:1} Final Clinical Impression(s) / ED Diagnoses Final diagnoses:  None    Rx / DC Orders ED Discharge Orders     None

## 2022-03-01 DIAGNOSIS — R051 Acute cough: Secondary | ICD-10-CM | POA: Diagnosis not present

## 2022-03-01 DIAGNOSIS — R0981 Nasal congestion: Secondary | ICD-10-CM | POA: Diagnosis not present

## 2022-03-01 DIAGNOSIS — Z6822 Body mass index (BMI) 22.0-22.9, adult: Secondary | ICD-10-CM | POA: Diagnosis not present

## 2022-03-01 DIAGNOSIS — J40 Bronchitis, not specified as acute or chronic: Secondary | ICD-10-CM | POA: Diagnosis not present

## 2022-03-01 DIAGNOSIS — R0602 Shortness of breath: Secondary | ICD-10-CM | POA: Diagnosis not present

## 2022-03-29 DIAGNOSIS — M545 Low back pain, unspecified: Secondary | ICD-10-CM | POA: Diagnosis not present

## 2022-03-29 DIAGNOSIS — N201 Calculus of ureter: Secondary | ICD-10-CM | POA: Diagnosis not present

## 2022-03-29 DIAGNOSIS — T50905A Adverse effect of unspecified drugs, medicaments and biological substances, initial encounter: Secondary | ICD-10-CM | POA: Diagnosis not present

## 2022-03-29 DIAGNOSIS — R1084 Generalized abdominal pain: Secondary | ICD-10-CM | POA: Diagnosis not present

## 2022-03-29 DIAGNOSIS — R110 Nausea: Secondary | ICD-10-CM | POA: Diagnosis not present

## 2022-03-29 DIAGNOSIS — R1031 Right lower quadrant pain: Secondary | ICD-10-CM | POA: Diagnosis not present

## 2022-03-29 DIAGNOSIS — R109 Unspecified abdominal pain: Secondary | ICD-10-CM | POA: Diagnosis not present

## 2022-03-29 DIAGNOSIS — R000 Tachycardia, unspecified: Secondary | ICD-10-CM | POA: Diagnosis not present

## 2022-03-29 DIAGNOSIS — N200 Calculus of kidney: Secondary | ICD-10-CM | POA: Diagnosis not present

## 2022-03-29 DIAGNOSIS — Z743 Need for continuous supervision: Secondary | ICD-10-CM | POA: Diagnosis not present

## 2022-03-29 DIAGNOSIS — R253 Fasciculation: Secondary | ICD-10-CM | POA: Diagnosis not present
